# Patient Record
Sex: Female | Born: 1980 | Hispanic: No | Marital: Married | Smoking: Never smoker
Health system: Southern US, Community
[De-identification: ages and names within clinical notes are randomized; demographics above are authoritative.]

## PROBLEM LIST (undated history)

## (undated) DIAGNOSIS — E559 Vitamin D deficiency, unspecified: Secondary | ICD-10-CM

## (undated) HISTORY — DX: Vitamin D deficiency, unspecified: E55.9

---

## 2004-02-11 ENCOUNTER — Other Ambulatory Visit: Admission: RE | Admit: 2004-02-11 | Discharge: 2004-02-11 | Payer: Self-pay | Admitting: Obstetrics and Gynecology

## 2004-05-15 ENCOUNTER — Inpatient Hospital Stay (HOSPITAL_COMMUNITY): Admission: AD | Admit: 2004-05-15 | Discharge: 2004-05-15 | Payer: Self-pay | Admitting: Obstetrics and Gynecology

## 2004-12-12 ENCOUNTER — Encounter: Admission: RE | Admit: 2004-12-12 | Discharge: 2004-12-12 | Payer: Self-pay | Admitting: Obstetrics & Gynecology

## 2005-01-20 ENCOUNTER — Inpatient Hospital Stay (HOSPITAL_COMMUNITY): Admission: AD | Admit: 2005-01-20 | Discharge: 2005-01-20 | Payer: Self-pay | Admitting: Obstetrics and Gynecology

## 2005-02-25 ENCOUNTER — Inpatient Hospital Stay (HOSPITAL_COMMUNITY): Admission: AD | Admit: 2005-02-25 | Discharge: 2005-02-25 | Payer: Self-pay | Admitting: Obstetrics and Gynecology

## 2005-05-25 ENCOUNTER — Observation Stay (HOSPITAL_COMMUNITY): Admission: AD | Admit: 2005-05-25 | Discharge: 2005-05-25 | Payer: Self-pay | Admitting: Obstetrics and Gynecology

## 2005-05-26 ENCOUNTER — Encounter (INDEPENDENT_AMBULATORY_CARE_PROVIDER_SITE_OTHER): Payer: Self-pay | Admitting: Specialist

## 2005-05-26 ENCOUNTER — Inpatient Hospital Stay (HOSPITAL_COMMUNITY): Admission: AD | Admit: 2005-05-26 | Discharge: 2005-05-28 | Payer: Self-pay | Admitting: Obstetrics & Gynecology

## 2005-12-20 ENCOUNTER — Emergency Department (HOSPITAL_COMMUNITY): Admission: EM | Admit: 2005-12-20 | Discharge: 2005-12-20 | Payer: Self-pay | Admitting: Family Medicine

## 2007-08-07 ENCOUNTER — Ambulatory Visit: Payer: Self-pay | Admitting: Internal Medicine

## 2007-08-07 LAB — CONVERTED CEMR LAB
BUN: 8 mg/dL (ref 6–23)
Basophils Absolute: 0 10*3/uL (ref 0.0–0.1)
Basophils Relative: 0.3 % (ref 0.0–1.0)
CO2: 28 meq/L (ref 19–32)
Calcium: 9.3 mg/dL (ref 8.4–10.5)
Chloride: 106 meq/L (ref 96–112)
Creatinine, Ser: 0.5 mg/dL (ref 0.4–1.2)
Eosinophils Absolute: 0.1 10*3/uL (ref 0.0–0.6)
Eosinophils Relative: 1.6 % (ref 0.0–5.0)
GFR calc Af Amer: 190 mL/min
GFR calc non Af Amer: 157 mL/min
Glucose, Bld: 63 mg/dL — ABNORMAL LOW (ref 70–99)
HCT: 36.3 % (ref 36.0–46.0)
Hemoglobin: 12.7 g/dL (ref 12.0–15.0)
Lymphocytes Relative: 19.5 % (ref 12.0–46.0)
MCHC: 35 g/dL (ref 30.0–36.0)
MCV: 91.3 fL (ref 78.0–100.0)
Monocytes Absolute: 0.6 10*3/uL (ref 0.2–0.7)
Monocytes Relative: 7.1 % (ref 3.0–11.0)
Neutro Abs: 6.3 10*3/uL (ref 1.4–7.7)
Neutrophils Relative %: 71.5 % (ref 43.0–77.0)
Platelets: 167 10*3/uL (ref 150–400)
Potassium: 4 meq/L (ref 3.5–5.1)
RBC: 3.98 M/uL (ref 3.87–5.11)
RDW: 12.7 % (ref 11.5–14.6)
Sodium: 138 meq/L (ref 135–145)
WBC: 8.7 10*3/uL (ref 4.5–10.5)

## 2007-09-10 ENCOUNTER — Ambulatory Visit: Payer: Self-pay | Admitting: Internal Medicine

## 2008-02-23 ENCOUNTER — Inpatient Hospital Stay (HOSPITAL_COMMUNITY): Admission: AD | Admit: 2008-02-23 | Discharge: 2008-02-25 | Payer: Self-pay | Admitting: Obstetrics and Gynecology

## 2008-12-30 ENCOUNTER — Ambulatory Visit (HOSPITAL_COMMUNITY): Payer: Self-pay | Admitting: Psychiatry

## 2009-03-22 ENCOUNTER — Ambulatory Visit: Payer: Self-pay | Admitting: Diagnostic Radiology

## 2009-03-22 ENCOUNTER — Emergency Department (HOSPITAL_BASED_OUTPATIENT_CLINIC_OR_DEPARTMENT_OTHER): Admission: EM | Admit: 2009-03-22 | Discharge: 2009-03-22 | Payer: Self-pay | Admitting: Emergency Medicine

## 2010-06-09 IMAGING — CR DG FOOT COMPLETE 3+V*R*
3 series · 3 of 3 positions shown · non-contrast
Comparison: None available

CLINICAL DATA: Fall.  Right foot injury.

RIGHT FOOT COMPLETE - 3+ VIEW

[t foot ap right]
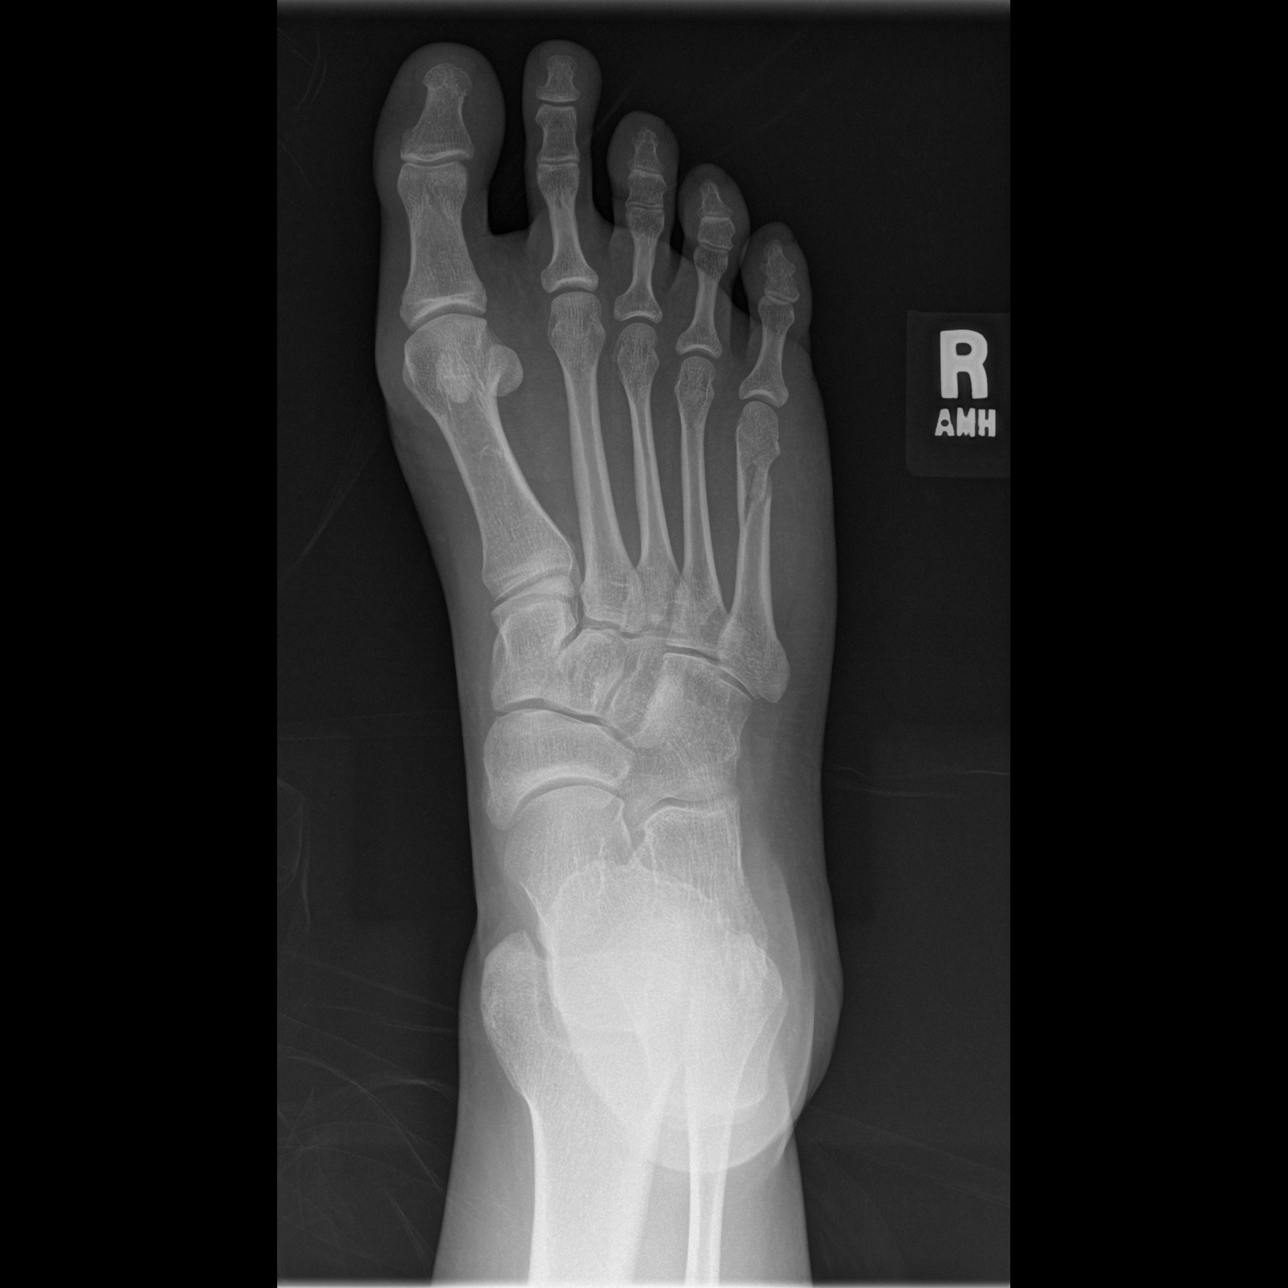

[t foot oblique right]
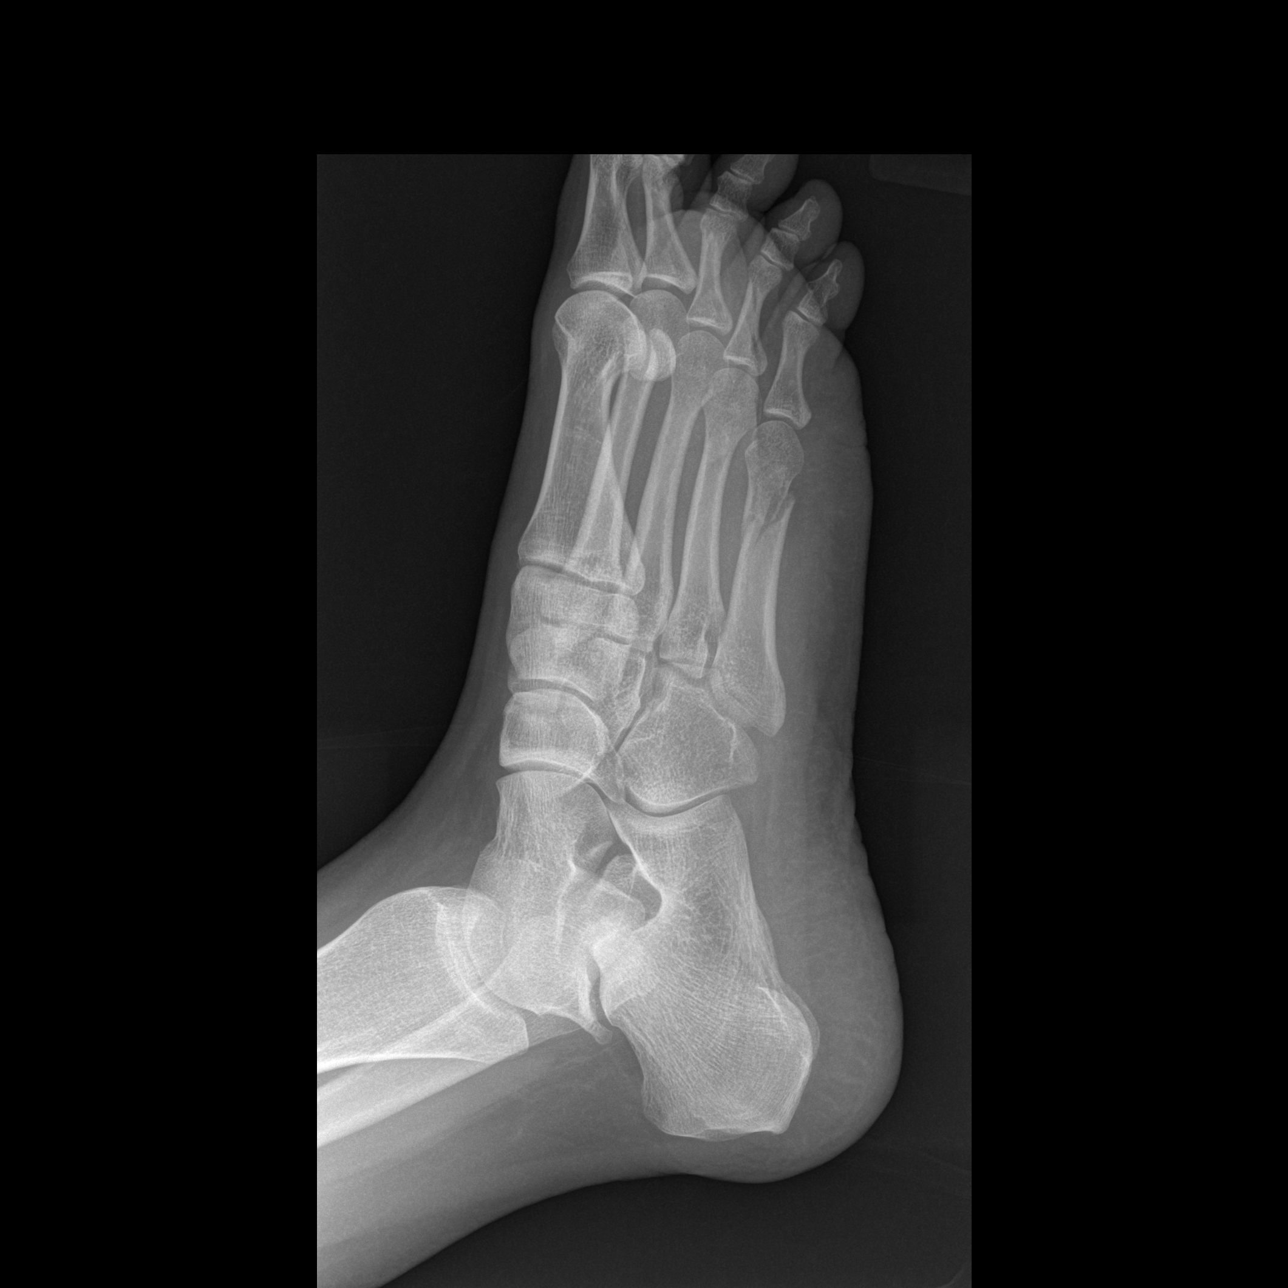

[t foot lat right]
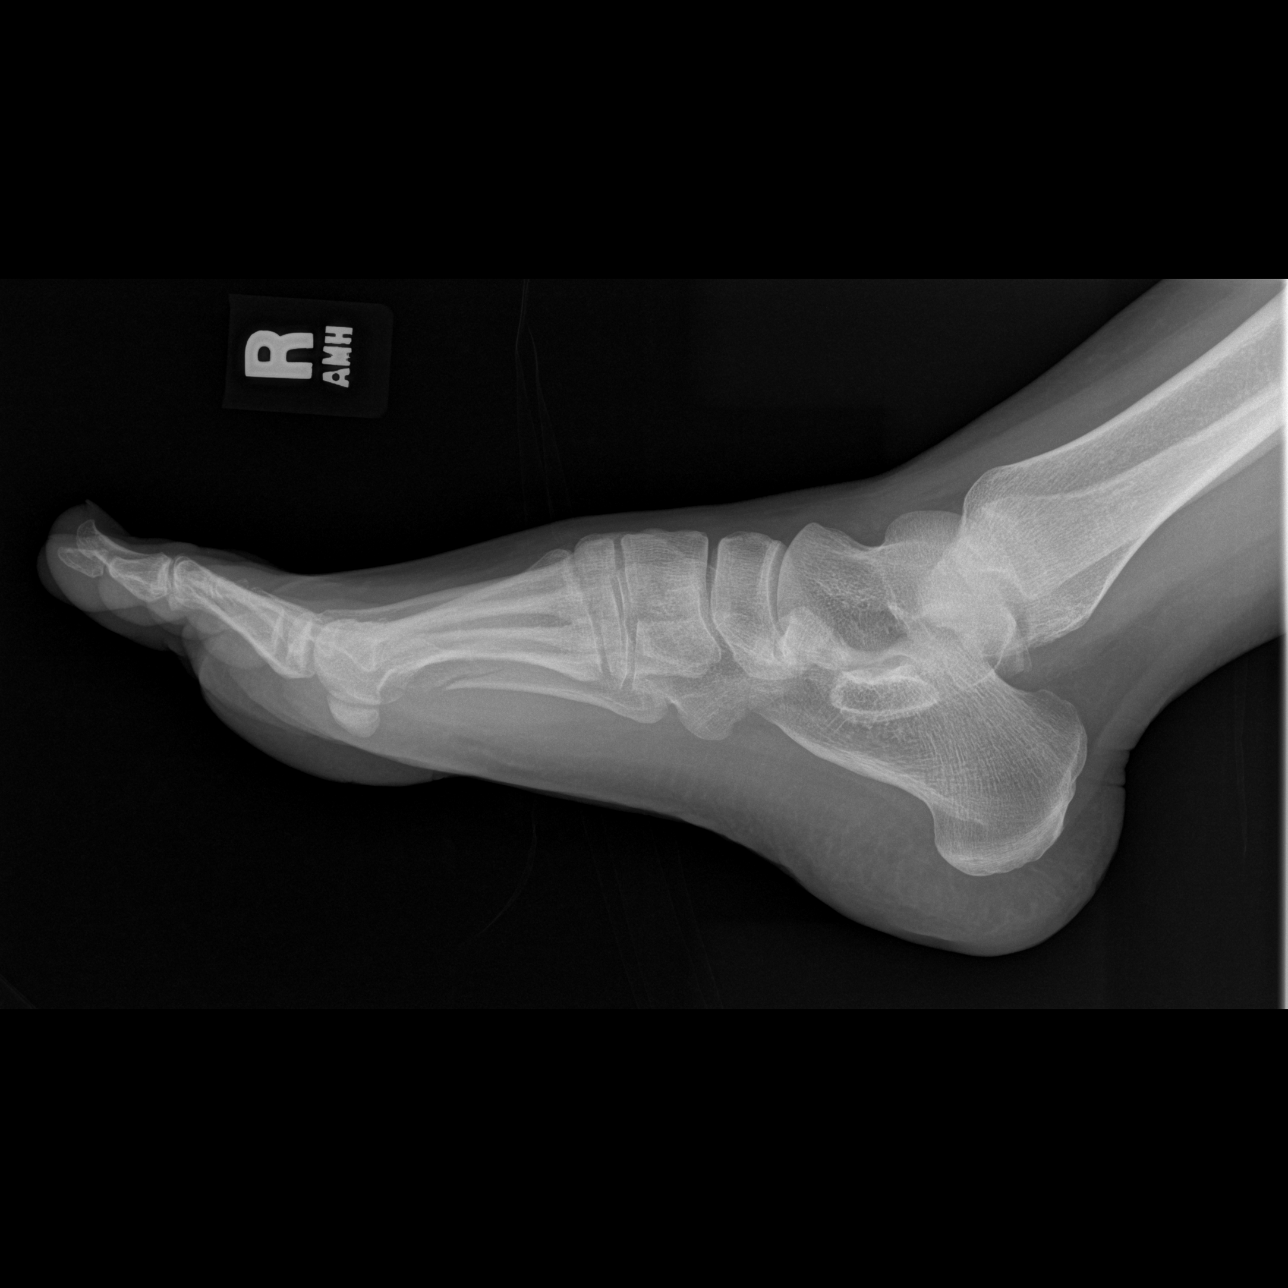

[3 of 3 positions shown; findings below may reference images not displayed]

FINDINGS: Oblique fracture of the right fifth metatarsal shaft,
minimally displaced.  Other metatarsals appear within normal
limits.  Soft tissue swelling is present on the lateral aspect of
the foot.
IMPRESSION: Oblique distal right fifth metatarsal shaft fracture.

## 2011-03-16 ENCOUNTER — Other Ambulatory Visit: Payer: Self-pay | Admitting: Obstetrics and Gynecology

## 2011-05-09 NOTE — Assessment & Plan Note (Signed)
[facility]                             PULMONARY OFFICE NOTE   Jennifer Little, Jennifer Little                      MRN:          [id number]  DATE:09/10/2007                            DOB:          July 16, 1981    HISTORY:  This is a 30 year old [place] female who is now four months  pregnant and continues to have paroxysms of dyspnea that seem to be most  related to stress.  They do not occur nocturnally or reproducibly with  exertion.  This is a somewhat different history than I heard previously.  It is not associated with any chest pain or cough, orthopnea, PND or leg  swelling.   PHYSICAL EXAMINATION:  GENERAL:  She is an anxious [place] female, in no  acute distress.  VITAL SIGNS:  Normal.  HEENT:  Unremarkable.  Pharynx clear.  LUNGS:  Fields perfectly clear bilaterally to auscultation and  percussion.  HEART:  A regular rhythm without murmur, gallop or rub.  No tachycardia  or infusion of P2 or displacement of the PMI.  ABDOMEN:  Soft, benign.  EXTREMITIES:  Warm without calf tenderness, cyanosis, clubbing or edema.   We walked her around the office three laps, 185 feet each, quite  rapidly.  She did not appear to be in any distress and her saturations  maintained in the mid-90's.   PFTs were reviewed today and are completely normal, despite the fact  that she is continuing to have spells here in the office and has no  improvement after bronchodilators.   IMPRESSION:  There is no evidence of a pulmonary process here.  She  admits that stress seems to be a problem and the reason she is so  stressed on this pregnancy, versus her previous one, is that she is  taking care of a child while having a child.   I spent a lot of time trying to explain this mechanism of  hyperventilation to her and to her husband, emphasizing that although it  is usually benign, I strongly recommend that it be taken seriously,  given the fact that she shifts the hemoglobin  saturation with  hyperventilation, to the point where she may decrease the amount of  oxygen that her baby is receiving.   Normally in non-pregnant adults we would consider using Xanax for this,  but I am going to defer the management of stress-induced anxiety and  hyperventilation back to Dr. Maxie Better' capable hands and see  her here on a p.r.n. basis.   Given the above findings, my opinion is that it is extremely unlikely  that she has either any significant asthma or occult heart or lung  disease.     [name]. Sherene Sires, MD, [facility]  Electronically Signed    MBW/MedQ  DD: 09/10/2007  DT: 09/11/2007  Job #: [id number]   cc:   Maxie Better, M.D.

## 2011-05-09 NOTE — Assessment & Plan Note (Signed)
[facility]                             PULMONARY OFFICE NOTE   [name], [name]                      MRN:          [id number]  DATE:08/07/2007                            DOB:          13-Jul-1981    HISTORY:  This is a 30 year old [place] female who speaks limited  English and gives a history of acute onset dyspnea 3 months ago when she  learned she was pregnant, which has worsened just a little bit since  that time.  She notices it is all the time, but especially if she  tries to go up stairs.  She says she has it at rest, but it does not  disturb her sleep.  She denies any associated chest pain, fevers,  chills, sweats, cough.  She does have heartburn symptoms and also nausea  and vomiting that have occurred since pregnancy, but she says this is no  different from her last pregnancy, and actually she is gaining more  weight now than she did with her last pregnancy despite the nausea and  vomiting.  Her last pregnancy was 3 years ago and was associated with  mild dyspnea at the end of pregnancy only.   She denies any leg swelling, pleuritic or exertional chest pain,  orthopnea or PND.   PAST MEDICAL HISTORY:  She has had low blood pressure for years, and  intermittently feels lightheaded when she stands.  This is no worse now  than usual.  Otherwise, significant only for pregnancy.   ALLERGIES:  None known.   MEDICATIONS:  Prenatal vitamins only.   SOCIAL HISTORY:  She has never smoked.  She has worked as a Environmental health practitioner in the past.   FAMILY HISTORY:  Significant for the absence of respiratory diseases and  clotting disorders.   REVIEW OF SYSTEMS:  Taken in detail on the work sheet and negative.   PHYSICAL EXAMINATION:  GENERAL:  This is a young [place] female who  speaks limited English who does not appear healthy to me.  VITAL SIGNS:  She is afebrile with stable vital signs.  Blood pressure  86/60 (she says her blood  pressure runs low).  HEENT:  Unremarkable.  Oropharynx clear.  NECK:  Supple without cervical adenopathy or tenderness.  Trachea is  midline.  No thyromegaly.  LUNGS:  Lung fields are perfectly clear bilaterally to auscultation and  percussion.  HEART:  Regular rhythm without murmur, gallop, or rub.  ABDOMEN:  Soft, benign.  EXTREMITIES:  Warm without calf tenderness.  No clubbing, cyanosis, or  edema.   We walked the patient around the office.  She started with a pulse rate  of 112 with 99% saturation but dropped to 91% after 2 laps and felt  tired.   LABORATORY DATA:  BMET and a CBC.  I had recommended a chest x-ray, and  based on the decreased saturation with exercise, recommended proceeding  with a CT scan of the chest looking for evidence of either occult  embolic disease or interstitial lung disease, but the patient and her  husband refuse to have  this done, even after discussion of the  diagnostic possibilities and concerns I have regarding inadequate oxygen  supply to the fetus.  They stated they would like to talk to Dr. Cherly Hensen  first about this.   I really do not have an explanation for her symptoms which seem a bit  unusual in that she states she is short of breath at rest (which sounds  like hyperventilation syndrome), but would not explain the decreased  saturation that occurs reproducibly with exercise.  Thromboembolic  disease and occult interstitial lung disease both need to be considered  here.   I also question the overall healthiness of this patient.  She looked  unhealthy to me, did not actually look 3 months pregnant, although her  husband assures me that she looks better now than she did with her first  pregnancy, and is actually gaining weight despite her history of nausea  and vomiting.  I also noticed a dynamic that occurred in the room  between the husband, their 62-year-old and the wife that suggested to me  the possibility of functional illness, but,  of course, again, that is  not likely the only issue given the decreased saturation we documented  here.     Jennifer Little. Jennifer Sires, MD, [facility]  Electronically Signed    MBW/MedQ  DD: 08/07/2007  DT: 08/08/2007  Job #: [id number]   cc:   Maxie Better, M.D.

## 2011-09-18 LAB — RH IMMUNE GLOB WKUP(>/=20WKS)(NOT WOMEN'S HOSP): Fetal Screen: NEGATIVE

## 2011-09-18 LAB — CBC
HCT: 30.1 — ABNORMAL LOW
HCT: 36
Hemoglobin: 10.6 — ABNORMAL LOW
Hemoglobin: 12.6
MCHC: 35.1
MCHC: 35.2
MCV: 90.2
MCV: 90.3
Platelets: 146 — ABNORMAL LOW
Platelets: 169
RBC: 3.33 — ABNORMAL LOW
RBC: 3.99
RDW: 13.4
RDW: 13.6
WBC: 12.2 — ABNORMAL HIGH
WBC: 15 — ABNORMAL HIGH

## 2011-09-18 LAB — RPR: RPR Ser Ql: NONREACTIVE

## 2012-06-10 ENCOUNTER — Institutional Professional Consult (permissible substitution): Payer: Self-pay | Admitting: Internal Medicine

## 2012-10-21 ENCOUNTER — Ambulatory Visit (INDEPENDENT_AMBULATORY_CARE_PROVIDER_SITE_OTHER): Payer: BC Managed Care – PPO | Admitting: Psychiatry

## 2012-10-21 ENCOUNTER — Encounter (HOSPITAL_COMMUNITY): Payer: Self-pay

## 2012-10-21 ENCOUNTER — Encounter (HOSPITAL_COMMUNITY): Payer: Self-pay | Admitting: Psychiatry

## 2012-10-21 VITALS — BP 99/67 | HR 60 | Wt 138.0 lb

## 2012-10-21 DIAGNOSIS — F329 Major depressive disorder, single episode, unspecified: Secondary | ICD-10-CM

## 2012-10-21 MED ORDER — LORAZEPAM 0.5 MG PO TABS: 0.5000 mg | ORAL_TABLET | Freq: Every day | ORAL | Status: DC | PRN

## 2012-10-21 MED ORDER — BUPROPION HCL ER (XL) 150 MG PO TB24: 150.0000 mg | ORAL_TABLET | ORAL | Status: DC

## 2012-10-21 NOTE — Progress Notes (Signed)
Chief complaint Depression  History of presenting illness Patient is 31 year old [place] American employed married female who is self-referred for seeking treatment.  Patient came with her husband.  Patient endorse increased anxiety depression and irritability for past 2 months.  Patient has history of anxiety and depression for many years however recently her intensity of depression is been increased.  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  She feels overwhelmed.  She has 2 children and sometimes she is unable to finish her projects.  When she comes from work she feel that her children's are neglecting.  Sometime she feels hopeless and helpless.  She endorse decreased energy decreased concentration hypersomnia and crying spells.  She also endorse passive suicidal thoughts and wished not to live anymore but denies any active suicidal thoughts or planning.  She denies any paranoia or any hallucination.  She also had difficulty adjusting in the community.  There are very limited [place] people and patient wants to move from this area to either live in [place] or any bigger city.  Her husband has a good job in this area at this time he is not interested to move out.  Patient endorse anhedonia, irritability, excessive worrying and fear and racing thoughts.  She denies any violence or aggression or any physical altercation with children or her husband.  She admitted having fear and anxiety and sometime panic attack going in the public places.  She had tried Prozac given by primary care physician but she felt worse with Prozac.  Currently she's not taking any medication.  Past psychiatric history Patient has seen in this office few years ago due to significant depression.  At that time she has a postpartum depression and she was given medication but patient stopped.  Patient also endorse history of taking antidepressant in [place].  She denies any previous history of suicidal attempt or any  inpatient psychiatric treatment.  She has given Prozac by her primary care physician.  Family history Patient parents takes antidepressant.  2 of her sister takes antidepressant.  Medical history Vitamin D deficiency.  Psychosocial history Patient was born in [place].  She is living with her husband and 2 children.  She has very limited social network.  Most of her family is in [place].  She goes once a year to visit them.  Education background and work history Patient has a post graduation in education.  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  Alcohol and substance use history Patient denies any history of alcohol or substance use.  Review of system. Patient is positive for anxiety, headache, fatigue, hypersomnia, decreased energy.  Mental status examination Patient is wearing a scarf.  She's age-appropriate.  She appears very tense anxious and easily tearful.  She described her mood is depressed and sad and her affect is constricted.  Her speech is slow but decreased in volume and tone.  Her thought process is also slow but logical linear and goal-directed.  She denies any active or passive suicidal thoughts or homicidal thoughts.  She denies any auditory or visual hallucination.  There were no flight of ideas or loose association.  There were no tremors or shakes.  There were no psychotic symptoms present at this time.  She has good fund of knowledge.  She's alert and oriented x3.  Her insight judgment and impulse control is okay.  Assessment Axis I Maj. depressive disorder Axis II deferred Axis III vitamin D deficiency Axis IV  moderate Axis V 65-70  Plan I talked to the patient in length and her husband about her symptoms and need of medication.  She had tried in the past Prozac with limited response.  We will try Wellbutrin and I will also add small dose lorazepam for extreme anxiety and nervousness.  I explain in detail the risk and benefits of medication.  We  talk about cultural issues.  I recommend to call us if she is any question or concern about the medication.  I explained the benzodiazepine dependence , tolerance and withdrawal symptoms.  I explained that we will stop lorazepam once Wellbutrin start working.  At this time patient does not want any individual counseling.  I will see her again in 2 weeks.

## 2012-11-07 ENCOUNTER — Ambulatory Visit (INDEPENDENT_AMBULATORY_CARE_PROVIDER_SITE_OTHER): Payer: Self-pay | Admitting: Psychiatry

## 2012-11-07 ENCOUNTER — Encounter (HOSPITAL_COMMUNITY): Payer: Self-pay | Admitting: Psychiatry

## 2012-11-07 VITALS — BP 109/70 | HR 78 | Wt 141.0 lb

## 2012-11-07 DIAGNOSIS — F329 Major depressive disorder, single episode, unspecified: Secondary | ICD-10-CM

## 2012-11-07 MED ORDER — LORAZEPAM 0.5 MG PO TABS: 0.5000 mg | ORAL_TABLET | Freq: Every day | ORAL | Status: AC | PRN

## 2012-11-07 MED ORDER — BUPROPION HCL ER (XL) 300 MG PO TB24: 300.0000 mg | ORAL_TABLET | ORAL | Status: DC

## 2012-11-07 NOTE — Progress Notes (Signed)
Patient ID: Jennifer Little, female   DOB: 10-18-1981, 31 y.o.   MRN: [id number]  Chief complaint I am doing better .    History of presenting illness Patient is 31 year old [place] American employed married female who came for her followup appointment.  Patient was seen 2 weeks ago and started her on Wellbutrin XL 150 mg and given lorazepam 0.5 mg for anxiety.  Patient shown some improvement from the past.  She feel less anxious and less depressed however she continued to have mood irritability frustration and crying spells.  She had a meltdown on the weekend.  She admitted it was for no reason but complain of crying spells, social isolation and did not talk to her husband.  Patient denies any side effects of medication.  She feels more energetic and less irritable.  She sleeping 6-7 hours.  She still takes lorazepam at bedtime is helping her anxiety and nervousness.  Her attention and concentration is better from the past.  She still has cultural.  And difficulty adjusting in the community.  However she has noticed improvement at her work.  She does not feel very anxious at work.  She denies any violence or aggression with children or her husband.  She started to go outside in public places.  She started to wear jewelery and paying attention to groom herself.  She's not drinking or using any illegal substance.  He denies acute tremors or shakes.  She's not drinking or using any illegal substance.  She spoke to her parent's who lives in [place], she was told that she was taking Celexa in [place].  Past psychiatric history Patient has seen in this office few years ago due to significant depression.  She has postpartum depression and she was given medication but patient stopped.  Patient endorse taking Celexa in [place].  She denies any previous history of suicidal attempt or any inpatient psychiatric treatment.  She has given Prozac by her primary care physician.  Family history Patient endorse parents and 2  sister takes antidepressant.    Medical history Vitamin D deficiency.  Psychosocial history Patient was born in [place].  She is living with her husband and 2 children.  She has very limited social network.  Most of her family is in [place].  She goes once a year to visit them.  Education background and work history Patient has a post graduation in education.  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  Alcohol and substance use history Patient denies any history of alcohol or substance use.  Review of Systems  Cardiovascular: Negative for palpitations.  Musculoskeletal: Negative for myalgias, back pain and falls.  Neurological: Positive for dizziness and headaches. Negative for tingling, tremors, seizures and loss of consciousness.  Psychiatric/Behavioral: Positive for depression. Negative for suicidal ideas, hallucinations, memory loss and substance abuse. The patient is nervous/anxious and has insomnia.    Current Outpatient Prescriptions on File Prior to Visit  Medication Sig Dispense Refill  . [DISCONTINUED] buPROPion (WELLBUTRIN XL) 150 MG 24 hr tablet Take 1 tablet (150 mg total) by mouth every morning.  30 tablet  0   Filed Vitals:   11/07/12 1620  BP: 109/70  Pulse: 78   Filed Weights   11/07/12 1620  Weight: 141 lb (63.957 kg)    Mental status examination Patient is casually dressed and well-groomed.  She is wearing a scarf.  She's age-appropriate.  She appears tense and anxious but overall her affect is improved from the past.  She described her mood is anxious .  Her speech is slow but clear and coherent but normal volume and tone.  Her thought process is slow but logical linear and goal-directed.  She denies any active or passive suicidal thoughts or homicidal thoughts.  She denies any auditory or visual hallucination.  There were no flight of ideas or loose association.  There were no tremors or shakes.  There were no psychotic symptoms present at this  time.  She has good fund of knowledge.  She's alert and oriented x3.  Her insight judgment and impulse control is okay.  Assessment Axis I Maj. depressive disorder Axis II deferred Axis III vitamin D deficiency Axis IV moderate Axis V 65-70  Plan I recommend to increase Wellbutrin XL 300 mg once she finished 150 mg.  I also recommend the use trazodone only as needed for severe anxiety and panic attack.  I explained risks and benefits of medication especially benzodiazepine can cause control tolerance and dependency.  I recommend to call us if she is any question or concern about the medication.  She does not want counseling or therapy.  I recommend to call us if she is any question or decided to see therapist.  We will get blood work on her next visit.  I will see her again in 4 weeks.  Time spent 30 minutes.

## 2012-12-05 ENCOUNTER — Ambulatory Visit (HOSPITAL_COMMUNITY): Payer: BC Managed Care – PPO | Admitting: Psychiatry

## 2014-10-15 ENCOUNTER — Ambulatory Visit (INDEPENDENT_AMBULATORY_CARE_PROVIDER_SITE_OTHER): Payer: BC Managed Care – PPO | Admitting: Psychiatry

## 2014-10-15 ENCOUNTER — Encounter (HOSPITAL_COMMUNITY): Payer: Self-pay | Admitting: Psychiatry

## 2014-10-15 VITALS — BP 114/85 | HR 70 | Ht 66.0 in | Wt 145.0 lb

## 2014-10-15 DIAGNOSIS — F332 Major depressive disorder, recurrent severe without psychotic features: Secondary | ICD-10-CM

## 2014-10-15 DIAGNOSIS — F331 Major depressive disorder, recurrent, moderate: Secondary | ICD-10-CM

## 2014-10-15 MED ORDER — BUPROPION HCL ER (XL) 150 MG PO TB24: ORAL_TABLET | ORAL | Status: DC

## 2014-10-15 NOTE — Progress Notes (Signed)
[facility] [id number] Progress Note  Jennifer Fairyakize K Nitschke [id number] 33 y.o.  10/15/2014 9:52 AM  Chief Complaint:  I'm feeling depressed and sad.  I'm not taking any medication.  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  She was last seen in October 2013 in this office.  She was taking Wellbutrin 300 mg daily.  The patient told that she and her husband moved to [place] last May and recently returned to months ago.  Patient told that thought that [place] will be a good place for her kids however she was very disappointed because she was not happy with the school system.  Her Childrens were believed and she has to change the school twice.  She was also not happy with the living situations.  Patient and her husband decided to come back [place].  Patient endorsed lately she's been feeling very sad depressed and anxious.  This summer she went to [place] and she find out that her daughter has ADD.  She was diagnosed and given Ritalin however asked a few days her daughter started to have palpitations and her medications was discontinued.  The patient told her daughter was also diagnosed with depression and now she is taking medication from the child psychiatry.  Patient feels increased guilty that she moved for the children and better outcome but that did not work.  She blamed herself for everything.  She feels her symptoms are coming back and she feels low self-esteem, crying spells, hopelessness and worthlessness.  She endorsed sleeping too much.  She denies any changes in her appetite and her weight has been stable.  Now she remembered that she has ADD symptoms but she was growing up.  She remembered unable to complete tasks because of poor attention and concentration.  Patient feels that her daughter is going through the same and she feels sad about it.  Patient endorsed  unable to do multitasking, decreased energy, anhedonia, lack of motivation and decreased interest in her daily life.  Patient to her pictures are visiting from [place] but she does not enjoy their company.  She admitted indecisiveness, clue cells seen, discouragement, and procrastination .  She admitted taken Ritalin from her daughters and that helped her energy and focus however she is not sure if she needed any ADD medication.  The patient does not drink or use any legal substances.  She denies any paranoia, hallucination, mania, panic attack or any aggression.  Patient denies any active or passive suicidal thoughts or homicidal, but endorsed hopeless helpless and worthless.  Suicidal Ideation: No Plan Formed: No Patient has means to carry out plan: No  Homicidal Ideation: No Plan Formed: No Patient has means to carry out plan: No  Medical History; Patient has history of vitamin D deficiency.  Clearly she has no primary care physician.  Education and Work History; Patient has post graduation and education.  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  Psychosocial History; Patient born and raised in therapy.  She lives with her husband and 2 children.  Her daughter is 35 years old and son is 35 years old.  She has limited social network. Most of her family lives in therapy.  History Of Abuse; Patient denies any history of physical sexual or verbal abuse.  Substance Abuse History; Patient denies any history of drinking using any illicit substance  use.  Past Psychiatric History/Hospitalization(s) Patient has history of depression for long time.  She has taken antidepressant when she was in [place].  She has seen in this office 2 times and given Wellbutrin.  She has also taken Prozac by her primary care physician however she had a limited response.  Patient denies any history of mania, psychosis, suicidal attempt or any inpatient psychiatric treatment.  She remembered  having ADD symptoms when she was growing up in the school.  She has difficulty focusing at school however she was able to manage good grades and able to finish her post graduation. Anxiety: Yes Bipolar Disorder: No Depression: No Mania: No Psychosis: No Schizophrenia: No Personality Disorder: No Hospitalization for psychiatric illness: No History of Electroconvulsive Shock Therapy: No Prior Suicide Attempts: No   Review of Systems: Psychiatric: Agitation: No Hallucination: No Depressed Mood: Yes Insomnia: No Hypersomnia: Yes Altered Concentration: No Feels Worthless: Yes Grandiose Ideas: No Belief In Special Powers: No New/Increased Substance Abuse: No Compulsions: No  Neurologic: Headache: No Seizure: No Paresthesias: No   Musculoskeletal: Strength & Muscle Tone: within normal limits Gait & Station: normal Patient leans: N/A  Outpatient Encounter Prescriptions as of 10/15/2014  Medication Sig  . triamcinolone ointment (KENALOG) 0.1 % Apply topically.  Marland Kitchen. buPROPion (WELLBUTRIN XL) 150 MG 24 hr tablet Take 1 tab daily for 2 weeks and than 2 tab daily  . [DISCONTINUED] buPROPion (WELLBUTRIN XL) 300 MG 24 hr tablet Take 1 tablet (300 mg total) by mouth every morning.    No results found for this or any previous visit (from the past 2160 hour(s)).  Physical Exam: Consitutional ;  BP 114/85  Pulse 70  Ht 5\' 6"  (1.676 m)  Wt 145 lb (65.772 kg)  BMI 23.41 kg/m2  Mental Status Examination;  Patient is casually dressed and groomed.  She is wearing a scarf. She's age-appropriate. She appears tense anxious and easily tearful. She described her mood is depressed and sad.  Her affect is constricted. Her speech is slow but decreased in volume and tone. Her thought process is also slow but logical linear and goal-directed. She denies any active or passive suicidal thoughts or homicidal thoughts. She denies any auditory or visual hallucination. There were no flight of ideas or  loose association.  Her psychomotor activity is slow.  Her fund of knowledge is average.  Her attention and concentration is Little.  There were no tremors or shakes. There were no psychotic symptoms present at this time. She has good fund of knowledge. She's alert and oriented x3. Her insight judgment and impulse control is okay.    Review of Psycho-Social Stressors (1), Decision to obtain old records (1), Review and summation of old records (2), Established Problem, Worsening (2), Review of Last Therapy Session (1), Review of Medication Regimen & Side Effects (2) and Review of New Medication or Change in Dosage (2)  Assessment: Axis I: Maj. depressive disorder, recurrent.  Rule out ADD  Axis II: Deferred  Axis III:  History reviewed. No pertinent past medical history.  Axis IV: Moderate   Plan:  I review her symptoms, history, previous medication and psychosocial stressors.  The patient had a good response with Wellbutrin in the past.  She has been noncompliant with medication for more than a year.  Due to the recent stressors especially her 35-year-old daughter diagnosed with ADD and being bullied at school making her symptoms worse.  I recommended to try Wellbutrin XL 150 mg daily for 2 weeks and  gradually increased to 300 daily which had helped her in the past.  I will consider adding a low dose stimulant if needed.  Discussed medication side effects in detail.  I also encourage to have her daughter seen by tall psychiatry, counseling and if needed report to school for being bullied.  Patient agreed with the plan.  Recommended to call us back if she has any question or any concern.  I will see her again in 4 weeks. Time spent 25 minutes.  More than 50% of the time spent in psychoeducation, counseling and coordination of care.  Discuss safety plan that anytime having active suicidal thoughts or homicidal thoughts then patient need to call 911 or go to the local emergency room.    Narely Nobles  T., MD 10/15/2014

## 2014-10-20 ENCOUNTER — Ambulatory Visit (HOSPITAL_COMMUNITY): Payer: Self-pay | Admitting: Psychiatry

## 2014-11-12 ENCOUNTER — Ambulatory Visit (INDEPENDENT_AMBULATORY_CARE_PROVIDER_SITE_OTHER): Payer: BC Managed Care – PPO | Admitting: Psychiatry

## 2014-11-12 ENCOUNTER — Encounter (HOSPITAL_COMMUNITY): Payer: Self-pay | Admitting: Psychiatry

## 2014-11-12 VITALS — BP 95/70 | HR 72 | Ht 66.0 in | Wt 140.6 lb

## 2014-11-12 DIAGNOSIS — F339 Major depressive disorder, recurrent, unspecified: Secondary | ICD-10-CM

## 2014-11-12 DIAGNOSIS — F331 Major depressive disorder, recurrent, moderate: Secondary | ICD-10-CM

## 2014-11-12 MED ORDER — BUPROPION HCL ER (XL) 300 MG PO TB24: 300.0000 mg | ORAL_TABLET | Freq: Every day | ORAL | Status: DC

## 2014-11-12 NOTE — Progress Notes (Signed)
[facility] [id number] Progress Note  Jennifer Little [id number] 33 y.o.  11/12/2014 10:41 AM  Chief Complaint:  ' medication management and follow-up.    History of Present Illness:  Jennifer Little came for her follow-up appointment.  On her last visit we started her on Jennifer which she had a good response in the past.  She is taking 300 mg every day.  Her depression, anxiety and attention is improved from the past.  She still nervous and anxious about her daughter who recently diagnosed with Jennifer Little.  Her daughter is taking Focalin and she believe her attention and focus is improved from the past.  However she still struggle with low self-esteem .  Patient endorse some time she is very busy taking care of her kids .  She endorsed some time racing thoughts and poor sleep but denies any agitation, anger or any crying spells.  She gets frustrated easily.  She does not want to try Jennifer Little medication because she has seen improvement with the Jennifer.  She has no tremors or shakes.  Her appetite is okay.  She has lost some weight from the past but overall her ADLs are good.  She denies drinking or using any illegal substances.  Patient lives with her husband and 2 kids.  Suicidal Ideation: No Plan Formed: No Patient has means to carry out plan: No  Homicidal Ideation: No Plan Formed: No Patient has means to carry out plan: No  Medical History; Patient has history of vitamin D deficiency.   Patient has no primary care physician.  Past Psychiatric History/Hospitalization(s) Patient has history of depression for long time.  She has taken antidepressant when she was in Jennifer Little.  She has seen in this office 2 times and given Jennifer.  She has also taken Jennifer Little by her primary care physician however she had a limited response.  Patient denies any history of mania, psychosis, suicidal attempt or any inpatient psychiatric treatment.  She remembered having Jennifer Little symptoms when she was growing up in the  school.  She has difficulty focusing at school however she was able to manage good grades and able to finish her post graduation. Anxiety: Yes Bipolar Disorder: No Depression: No Mania: No Psychosis: No Schizophrenia: No Personality Disorder: No Hospitalization for psychiatric illness: No History of Electroconvulsive Shock Therapy: No Prior Suicide Attempts: No   Review of Systems: Psychiatric: Agitation: No Hallucination: No Depressed Mood: Yes Insomnia: No Hypersomnia: No Altered Concentration: No Feels Worthless: No Grandiose Ideas: No Belief In Special Powers: No New/Increased Substance Abuse: No Compulsions: No  Neurologic: Headache: No Seizure: No Paresthesias: No   Musculoskeletal: Strength & Muscle Tone: within normal limits Gait & Station: normal Patient leans: N/A  Outpatient Encounter Prescriptions as of 11/12/2014  Medication Sig  . buPROPion (Jennifer Little) 300 MG 24 hr tablet Take 1 tablet (300 mg total) by mouth daily.  Marland Kitchen. triamcinolone ointment (KENALOG) 0.1 % Apply topically.  . [DISCONTINUED] buPROPion (Jennifer Little) 150 MG 24 hr tablet Take 1 tab daily for 2 weeks and than 2 tab daily    No results found for this or any previous visit (from the past 2160 hour(s)).  Physical Exam: Consitutional ;  BP 95/70 mmHg  Pulse 72  Ht 5\' 6"  (1.676 m)  Wt 140 lb 9.6 oz (63.776 kg)  BMI 22.70 kg/m2  Mental Status Examination;  Patient is casually dressed and groomed.  She is wearing a scarf. She's age-appropriate.   She appears anxious but cooperative.  She  described her mood euthymic and her affect is improved from the past.  Her attention and concentration is fair.  Her speech is slow but decreased in volume and tone. Her thought process logical and goal-directed.  She denies any active or passive suicidal thoughts or homicidal thoughts. She denies any auditory or visual hallucination. There were no flight of ideas or loose association.  Her psychomotor  activity is slow.  Her fund of knowledge is average.  There were no tremors or shakes. There were no psychotic symptoms present at this time. She has good fund of knowledge. She's alert and oriented x3. Her insight judgment and impulse control is okay.    Established Problem, Stable/Improving (1), Review of Psycho-Social Stressors (1), Review of Last Therapy Session (1) and Review of Medication Regimen & Side Effects (2)  Assessment: Axis I: Maj. depressive disorder, recurrent.  Rule out Jennifer Little  Axis II: Deferred  Axis III:  History reviewed. No pertinent past medical history.  Axis IV: Moderate   Plan:  Patient is showing improvement from the past.  Recommended to continue Jennifer Little 300 mg daily.  Patient like to see her daughter to be seen in this office.  We will schedule an appointment with Jennifer Little.  I will continue Jennifer Little 300 mg daily.  Discussed medication side effects and efficacy.  At this time patient does not have any side effects including any tremors or shakes.  I will see her again in 2 months.  A new prescription of Jennifer Little 300 mg daily with one more additional refill is given.  [name] T., MD 11/12/2014

## 2015-01-12 ENCOUNTER — Ambulatory Visit (HOSPITAL_COMMUNITY): Payer: Self-pay | Admitting: Psychiatry

## 2015-02-11 ENCOUNTER — Telehealth (HOSPITAL_COMMUNITY): Payer: Self-pay | Admitting: *Deleted

## 2015-02-11 ENCOUNTER — Encounter (HOSPITAL_COMMUNITY): Payer: Self-pay | Admitting: Psychiatry

## 2015-02-11 ENCOUNTER — Ambulatory Visit (INDEPENDENT_AMBULATORY_CARE_PROVIDER_SITE_OTHER): Payer: Commercial Managed Care - PPO | Admitting: Psychiatry

## 2015-02-11 VITALS — BP 105/64 | HR 66 | Ht 67.0 in | Wt 146.2 lb

## 2015-02-11 DIAGNOSIS — F331 Major depressive disorder, recurrent, moderate: Secondary | ICD-10-CM

## 2015-02-11 DIAGNOSIS — Z79899 Other long term (current) drug therapy: Secondary | ICD-10-CM

## 2015-02-11 MED ORDER — BUPROPION HCL ER (XL) 300 MG PO TB24: 300.0000 mg | ORAL_TABLET | Freq: Every day | ORAL | Status: DC

## 2015-02-11 MED ORDER — BUPROPION HCL ER (XL) 300 MG PO TB24: 300.0000 mg | ORAL_TABLET | Freq: Every day | ORAL | Status: AC

## 2015-02-11 NOTE — Progress Notes (Signed)
[facility] [id number] Progress Note  Jennifer Little [id number] 34 y.o.  02/11/2015 11:02 AM  Chief Complaint:  Medication management and follow-up.      History of Present Illness:  Jennifer Little came for her follow-up appointment.  She is taking her medication most of the time however last month she did not took medication for few days that she forgot and she felt very tired, fatigued and decided to have depressive symptoms.  She realized that she needed to take the medication every day and she reported much improvement since she takes as prescribed.  She sleeping good.  Her appetite is okay.  She denies any major panic attack or any depressive symptoms.  She reported her husband who is also a patient of this office is also doing very well.  She usually comes with her husband however today her husband is in [place] and could not come.  She is happy that her mother is visiting from [place] and helping taking care of the children.  Patient told that she decided not to give any psychotropic medication to her daughter and she is happy that her daughter is coming around very well.  Patient denies any crying spells, irritability, feeling of hopelessness or worthlessness.  She sleeping good.  Her appetite is okay.  She still have issues with low self-esteem but overall she reported medicine is working very well for her.  She has no tremors or shakes.  Patient denies drinking or using any illegal substances.  Patient lives with her husband and 2 children.  Suicidal Ideation: No Plan Formed: No Patient has means to carry out plan: No  Homicidal Ideation: No Plan Formed: No Patient has means to carry out plan: No  Medical History; Patient has history of vitamin D deficiency.   Patient has no primary care physician.  Past Psychiatric History/Hospitalization(s) Patient has history of depression and she has taken antidepressant when she was in [place].  In the past she had tried Prozac but she did very  well on Wellbutrin.  Patient denies any history of mania, psychosis, suicidal attempt or any inpatient psychiatric treatment.  She remembered having ADD symptoms when she was growing up in the school.  She has difficulty focusing at school however she was able to manage good grades and able to finish her post graduation. Anxiety: Yes Bipolar Disorder: No Depression: No Mania: No Psychosis: No Schizophrenia: No Personality Disorder: No Hospitalization for psychiatric illness: No History of Electroconvulsive Shock Therapy: No Prior Suicide Attempts: No   Review of Systems: Psychiatric: Agitation: No Hallucination: No Depressed Mood: No Insomnia: No Hypersomnia: No Altered Concentration: No Feels Worthless: No Grandiose Ideas: No Belief In Special Powers: No New/Increased Substance Abuse: No Compulsions: No  Neurologic: Headache: No Seizure: No Paresthesias: No   Musculoskeletal: Strength & Muscle Tone: within normal limits Gait & Station: normal Patient leans: N/A  Outpatient Encounter Prescriptions as of 02/11/2015  Medication Sig  . triamcinolone ointment (KENALOG) 0.1 % Apply topically.  . [DISCONTINUED] buPROPion (WELLBUTRIN XL) 300 MG 24 hr tablet Take 1 tablet (300 mg total) by mouth daily.  . [DISCONTINUED] buPROPion (WELLBUTRIN XL) 300 MG 24 hr tablet Take 1 tablet (300 mg total) by mouth daily.    No results found for this or any previous visit (from the past 2160 hour(s)).  Physical Exam: Consitutional ;  BP 105/64 mmHg  Pulse 66  Ht 5\' 7"  (1.702 m)  Wt 146 lb 3.2 oz (66.316 kg)  BMI 22.89 kg/m2  Mental  Status Examination;  Patient is casually dressed and groomed.  She is wearing a scarf.  She is pleasant and cooperative.  She described her mood euthymic and her affect is good.  Her attention and concentration is fair.  Her speech is slow but decreased in volume and tone. Her thought process logical and goal-directed.  She denies any active or passive  suicidal thoughts or homicidal thoughts. She denies any auditory or visual hallucination. There were no flight of ideas or loose association.  Her psychomotor activity is normal. Her fund of knowledge is average.  There were no tremors or shakes. There were no psychotic symptoms present at this time. She has good fund of knowledge. She's alert and oriented x3. Her insight judgment and impulse control is okay.  Established Problem, Stable/Improving (1), Review of Psycho-Social Stressors (1), Review or order clinical lab tests (1), Review of Last Therapy Session (1) and Review of Medication Regimen & Side Effects (2)  Assessment: Axis I: Maj. depressive disorder, recurrent.  Rule out ADD  Axis II: Deferred  Axis III:  History reviewed. No pertinent past medical history.  Plan:  Patient is doing better on current dose of Wellbutrin.  Encouraged to keep taking her medication as prescribed.  She does not have any side effects.  She has no blood work in a while.  I will do CBC, CMP, hemoglobin A1c, TSH .  Discussed medication side effects and benefits.  Recommended to call us back if she has any question or any concern.  Follow-up in 3 months.  Zanyiah Posten T., MD 02/11/2015

## 2015-02-11 NOTE — Telephone Encounter (Signed)
Dr. Lolly MustacheArfeen   Patient requested after office visit with you that she needed her Bupropion 300 mg sent to [facility] not [facility], forgot to tell you.  I cancelled the medication sent to [facility] and resent to [facility]. Just making you aware of the change.  Thank you.

## 2015-04-11 ENCOUNTER — Other Ambulatory Visit (HOSPITAL_COMMUNITY): Payer: Self-pay | Admitting: Psychiatry

## 2015-04-12 NOTE — Telephone Encounter (Signed)
Refill request for patient's Wellbutrin declined at this time due to too early to refill.  Patient had a new order e-scribed 02/11/15 for 90 day supply and returns for next evaluation on 05/12/15.

## 2015-05-12 ENCOUNTER — Ambulatory Visit (HOSPITAL_COMMUNITY): Payer: Self-pay | Admitting: Psychiatry

## 2016-01-28 ENCOUNTER — Ambulatory Visit: Payer: Self-pay | Admitting: Endocrinology

## 2018-09-02 ENCOUNTER — Other Ambulatory Visit: Payer: Self-pay | Admitting: Radiology

## 2019-10-20 ENCOUNTER — Ambulatory Visit (HOSPITAL_COMMUNITY): Payer: Commercial Managed Care - PPO

## 2019-10-20 ENCOUNTER — Other Ambulatory Visit (HOSPITAL_COMMUNITY): Payer: Self-pay | Admitting: Obstetrics and Gynecology

## 2019-10-20 ENCOUNTER — Other Ambulatory Visit: Payer: Self-pay | Admitting: Obstetrics and Gynecology

## 2019-10-20 DIAGNOSIS — R140 Abdominal distension (gaseous): Secondary | ICD-10-CM

## 2019-10-20 DIAGNOSIS — R1030 Lower abdominal pain, unspecified: Secondary | ICD-10-CM
